# Patient Record
Sex: Male | Born: 1950 | Race: White | Hispanic: No | Marital: Married
Health system: Northeastern US, Academic
[De-identification: ages and names within clinical notes are randomized; demographics above are authoritative.]

---

## 2022-05-01 IMAGING — CT CTA CORONARY
3 series · 14 of 20 positions shown, 16 images · IV contrast (isovue)
Comparison: None.

________________________________________________________________________________________________ 
CTA CORONARY, 05/01/2022 [DATE]: 
CLINICAL INDICATION: Coronary atherosclerosis. 
A search for DICOM formatted images was conducted for prior CT imaging studies 
completed at a non-affiliated media free facility.
TECHNIQUE: The region of interest was scanned with contrast on a high resolution 
CT scanner using dose reduction techniques. 3-D renderings were reconstructed on 
an independent rotation. 100 mL of Isovue 370 MDV were administered per 
protocol. 0 mL of Isovue 370 MDV were discarded. Count of known CT and Cardiac 
Nuclear Medicine studies performed in the previous 12 months = 0. 
Images were performed and reconstructed using ECG gating. To reduce the risk of 
nephrotoxicity and to adequately monitor the patient, the patient was monitored 
for over 30 minutes post procedure with 250 mL of normal saline administered 
intravenously. 
CORONARY ARTERY EVALUATION: 
Dominance: The patient is right dominant. 
Left Main: No plaque or stenosis. 
Left Anterior Descending: Moderate/moderate severe atherosclerotic plaque 
causing approximately 60% stenosis.. 
Left Circumflex: No plaque or stenosis. 
Right Coronary: No plaque or stenosis. 
CARDIAC ANATOMY: No chamber enlargement or hypertrophy.  No evidence of 
hypoperfusion or infarct. 
CARDIAC INDICES:  
Ejection fraction: 68% 
Ventricular end diastolic volume: 121 mL 
Ventricular end systolic volume: 39 mL 
Stroke volume: 82 mL 
Cardiac output 5.9 L/min 
AORTA/GREAT VESSELS: No aneurysm or dissection. 
LUNGS/ PLEURA: Peribronchial thickening and dependent atelectasis..  No 
effusion. 
MEDIASTINUM: No mass. 
OSSEOUS STRUCTURES: No acute fracture or destructive lesion.

[Series 5: corcta 0.75 i40s 2 bestdiast 77% · axial · 0.43mm/px · z∈[-195,-132]mm · 3 of 319 slices shown]
[im 80/319  vessel]
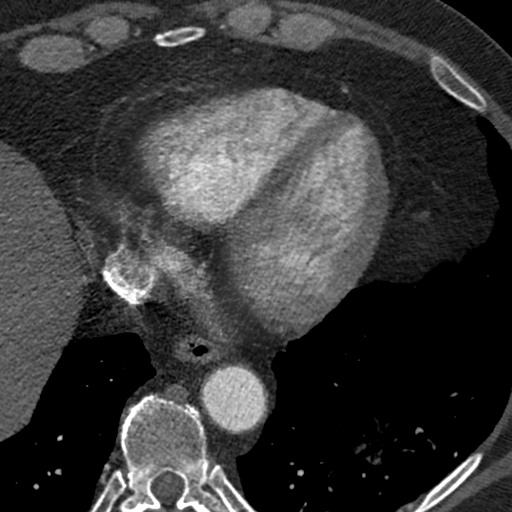
[im 160/319  vessel]
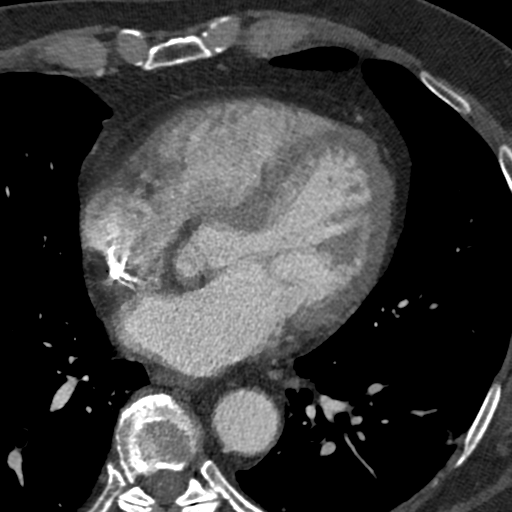
[im 239/319  vessel]
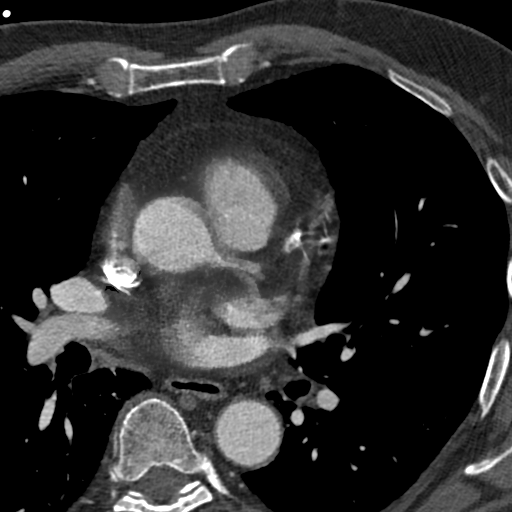

[Series 6: corcta 0.75 i40s 2 bestsyst 29% · axial · 0.43mm/px · z∈[-195,-132]mm · 3 of 319 slices shown]
[im 80/319  vessel]
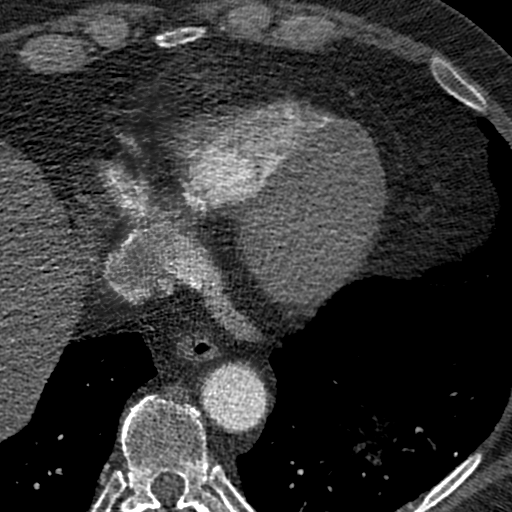
[im 160/319  vessel]
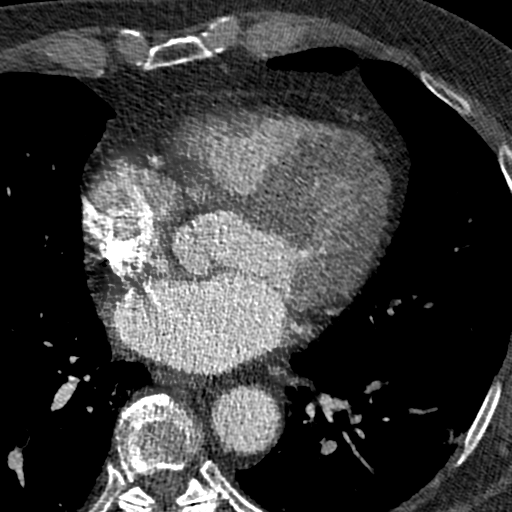
[im 239/319  vessel]
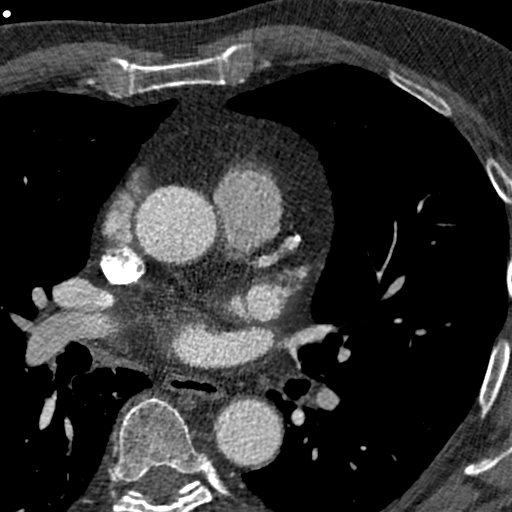

[Series 7: function 0-90% · axial · 0.43mm/px · z∈[-212,-114]mm · 8 of 640 slices shown, 10 images]
[im 72/640  vessel]
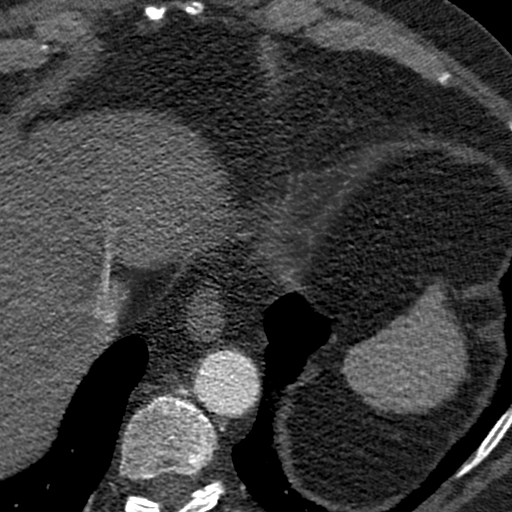
[im 72/640  lung]
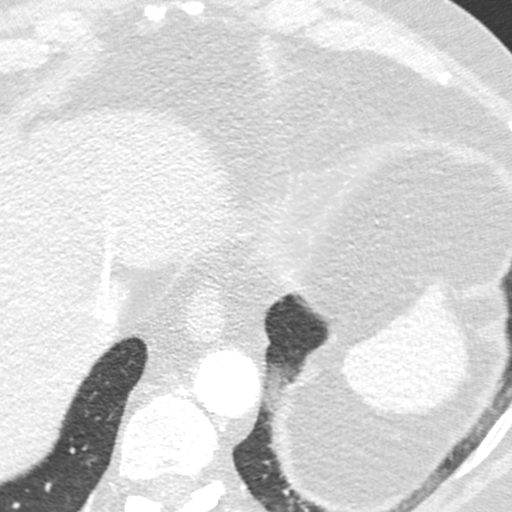
[im 143/640  vessel]
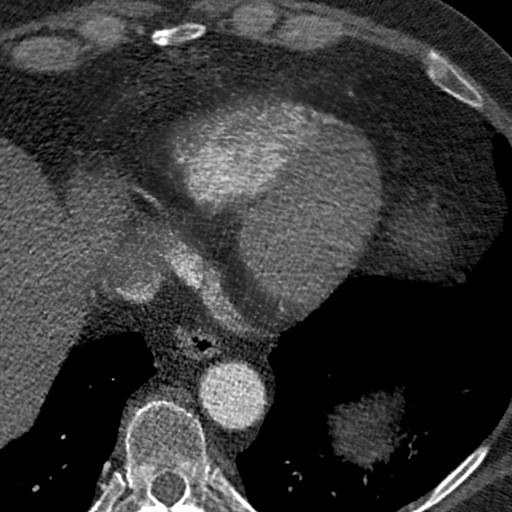
[im 214/640  vessel]
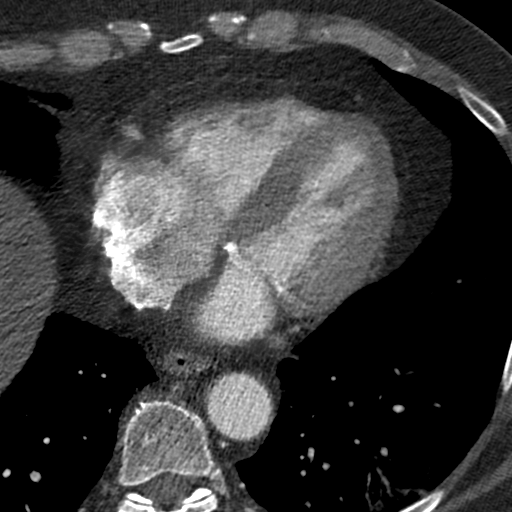
[im 285/640  vessel]
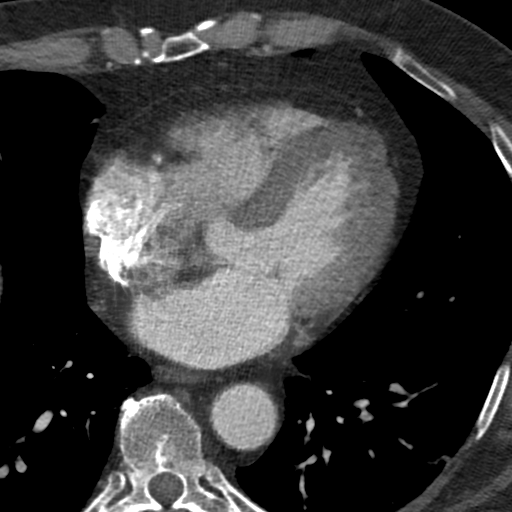
[im 356/640  vessel]
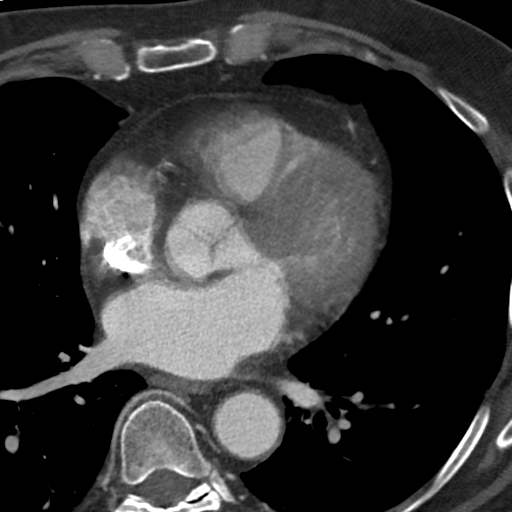
[im 356/640  lung]
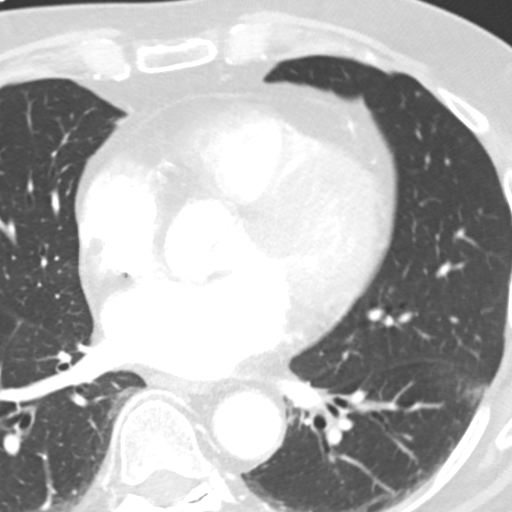
[im 427/640  vessel]
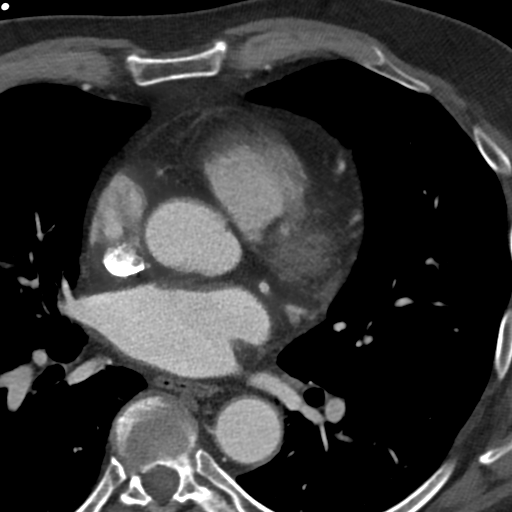
[im 498/640  vessel]
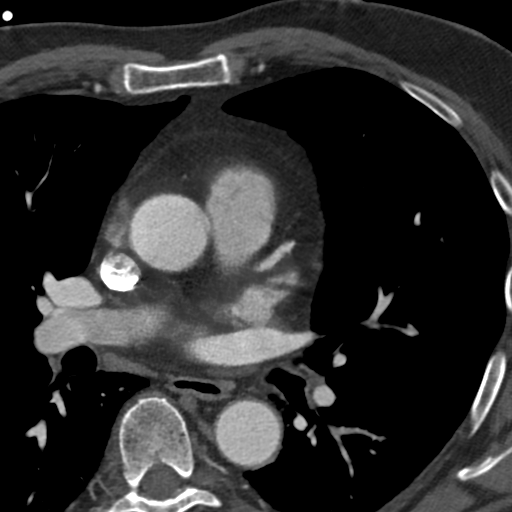
[im 569/640  vessel]
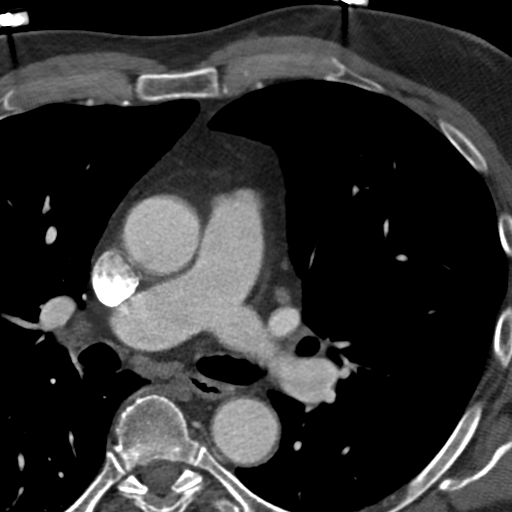

[14 of 20 positions shown; findings below may reference images not displayed]

IMPRESSION: 1. 60% stenosis left anterior descending artery.. 
2. No significant focal stenosis left main, left circumflex or right coronary 
artery. 
3. No significant extra-cardiac findings. 
CAD RADS: 
CAD-RADS 3      50-69%     Moderate stenosis 
RADIATION DOSE REDUCTION: All CT scans are performed using radiation dose 
reduction techniques, when applicable.  Technical factors are evaluated and 
adjusted to ensure appropriate moderation of exposure.  Automated dose 
management technology is applied to adjust the radiation doses to minimize 
exposure while achieving diagnostic quality images.

## 2022-06-21 ENCOUNTER — Inpatient Hospital Stay: Admit: 2022-06-21 | Discharge: 2022-06-21 | Payer: PRIVATE HEALTH INSURANCE

## 2022-06-21 IMAGING — MR MRI LUMBAR SPINE WITHOUT CONTRAST
7 of 8 series · 15 of 48 positions shown · IV contrast (gadolinium)
Comparison: None

________________________________________________________________________________________________ 
MRI LUMBAR SPINE WITHOUT CONTRAST, 06/21/2022 [DATE]: 
CLINICAL INDICATION: Lumbar spinal stenosis. Chronic low back pain radiates down 
the right leg. History of bladder cancer.
TECHNIQUE: Multiplanar, multiecho position MR images of the lumbar spine were 
performed without intravenous gadolinium enhancement. Patient was scanned on a 
1.5T magnet.

[Series 101: survey · axial · 10.0mm · 1.25mm/px · z∈[-33,+201]mm · 2 of 10 slices shown]
[im 1/10]
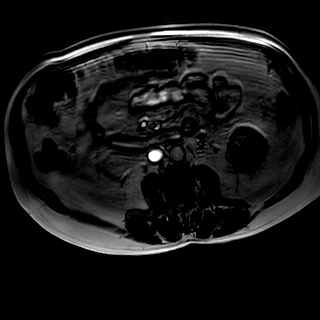
[im 10/10]
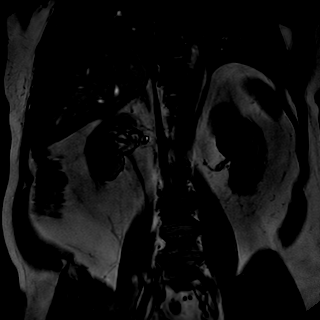

[Series 201: t2w_cor-surv · coronal · 6.0mm · 0.62mm/px · 1 of 10 slices shown]
[im 1/10]
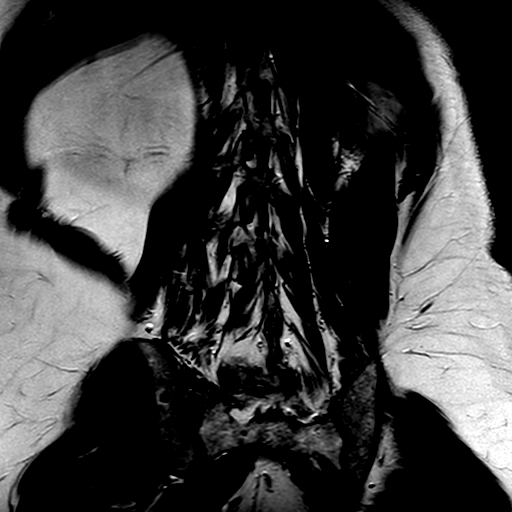

[Series 301: T1 · sagittal · 4.0mm · 0.46mm/px · 2 of 18 slices shown]
[im 1/18]
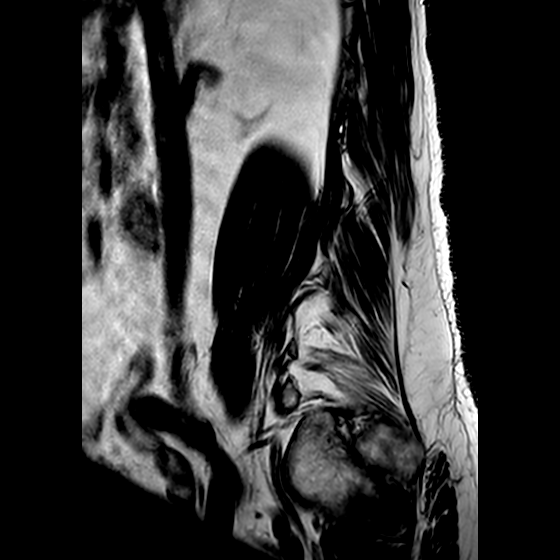
[im 18/18]
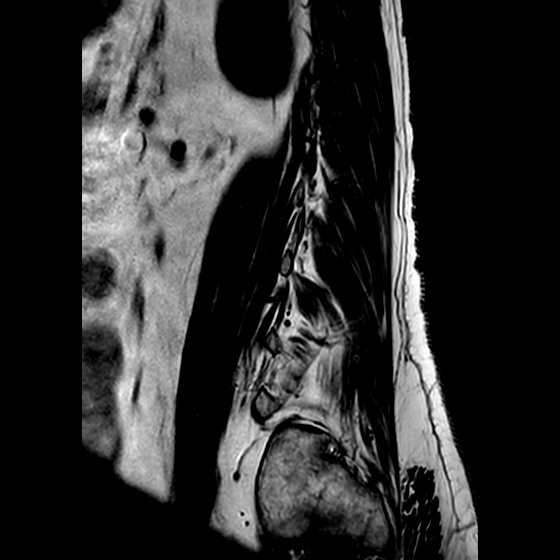

[Series 402: (id)_mdixon_tse · sagittal · 4.0mm · 0.50mm/px · 2 of 18 slices shown]
[im 1/18]
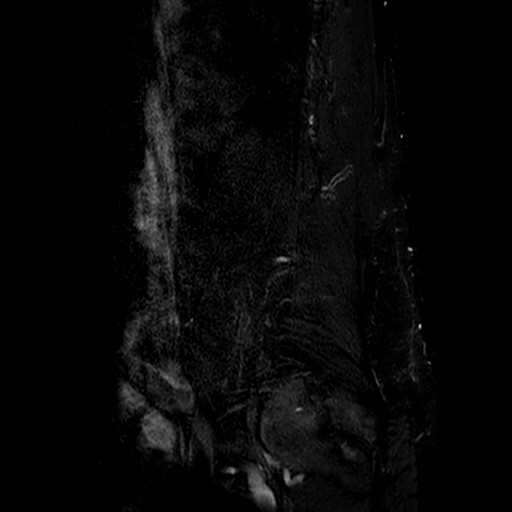
[im 18/18]
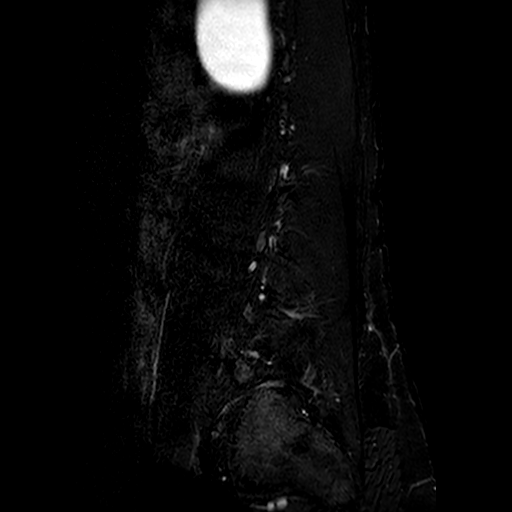

[Series 403: st2w_mdixon_tse · sagittal · 4.0mm · 0.50mm/px · 2 of 18 slices shown]
[im 1/18]
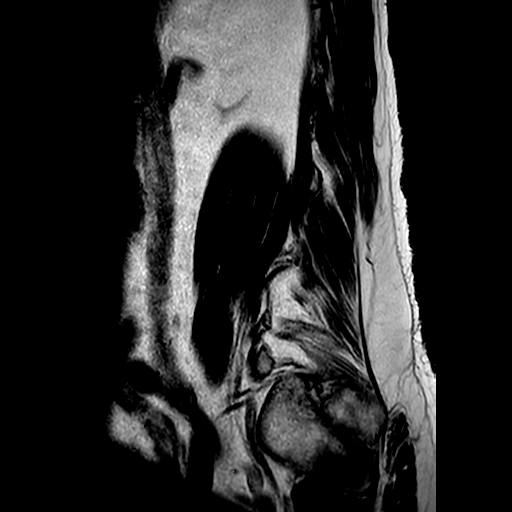
[im 18/18]
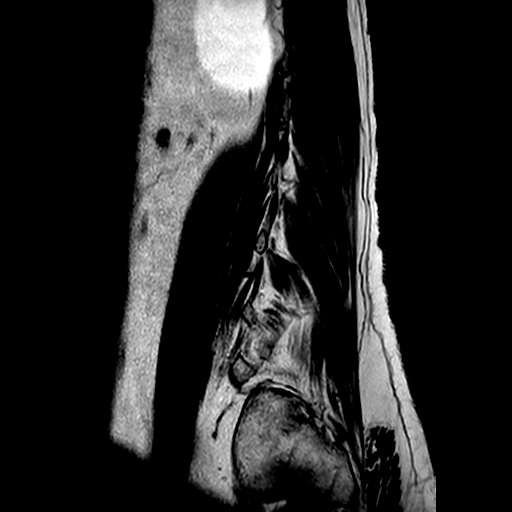

[Series 501: sag spineview ax · sagittal · 1.4mm · 0.33mm/px · 2 of 114 slices shown]
[im 1/114]
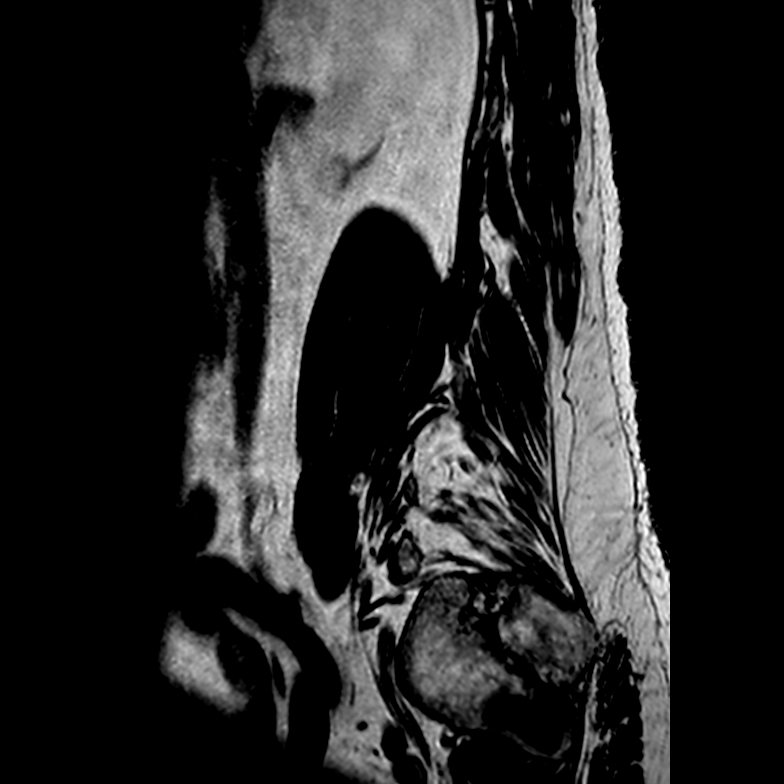
[im 17/114]
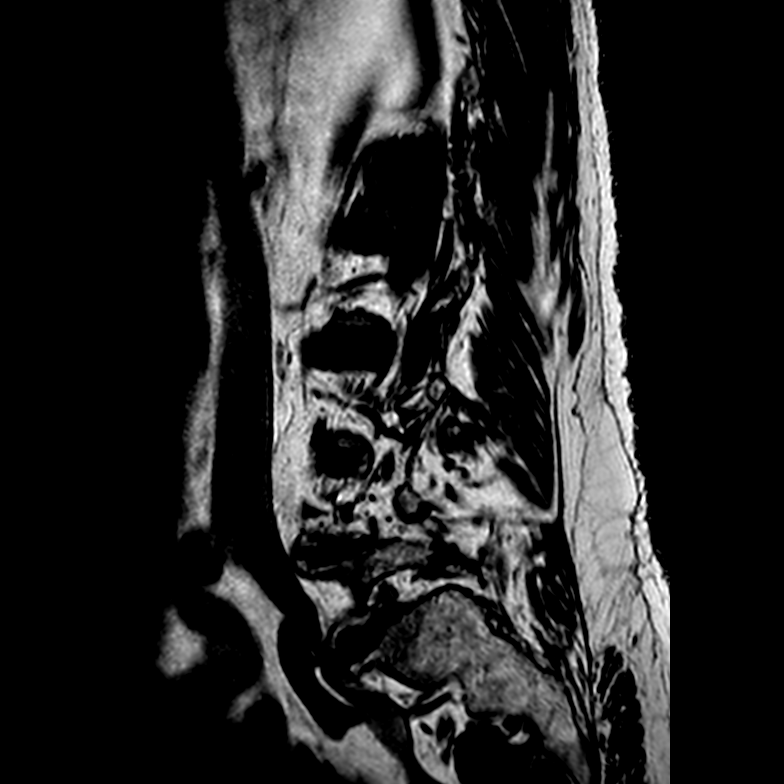

[Series 601: T2 · axial · 4.0mm · 0.30mm/px · z∈[-194,+41]mm · 4 of 30 slices shown]
[im 1/30]
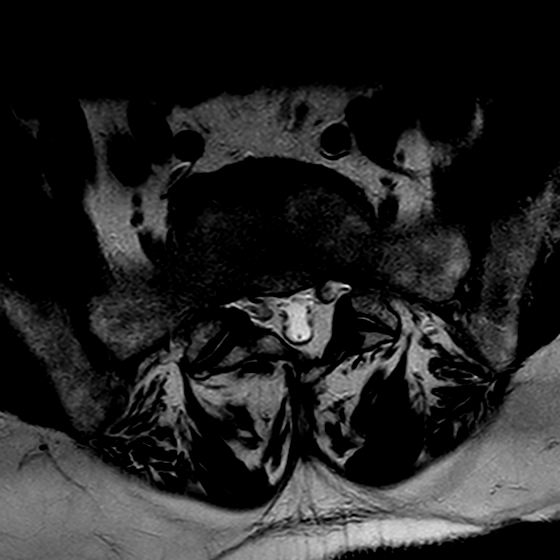
[im 10/30]
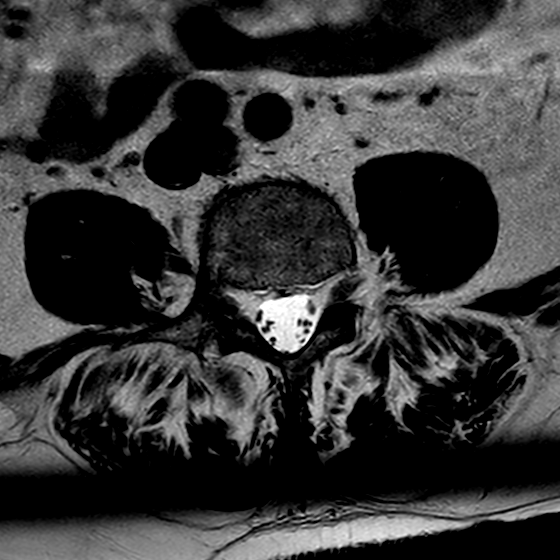
[im 20/30]
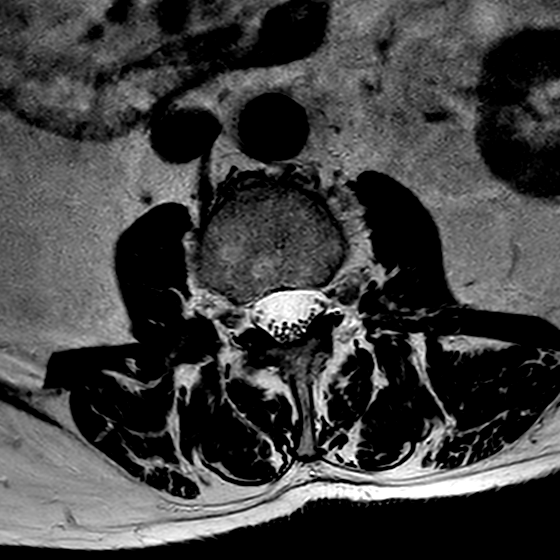
[im 30/30]
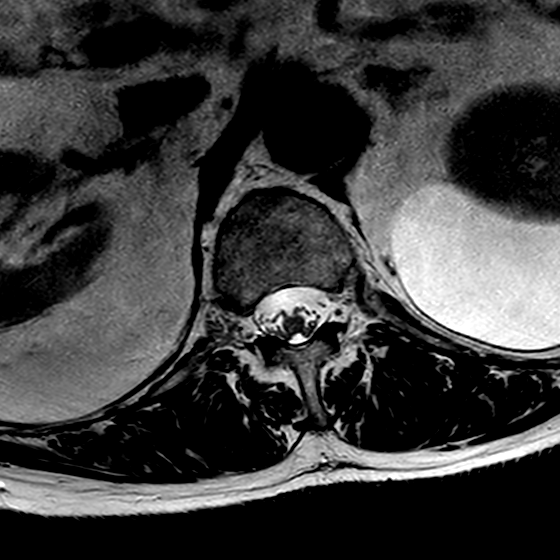

[15 of 48 positions shown; findings below may reference images not displayed]

FINDINGS: -------------------------------------------------------------------------------- 
------ 
GENERAL: 
Nomenclature is based on 5 lumbar type vertebral bodies.     
ALIGNMENT: Minimal levoconvex lumbar scoliosis. Loss of the normal lumbar 
lordosis with trace grade 1 retrolisthesis L2 on L3 and L3 on L4. Trace grade 1 
retrolisthesis L4 on L5. 8 mm anterolisthesis L5 on S1 related to bilateral L5 
spondylolysis. 
VERTEBRAL BODY HEIGHT: Normal.  
MARROW SIGNAL: There is minimal marrow edema within the left L5 pedicle which 
may reflect stress reaction. 
CORD SIGNAL: Normal distal spinal cord and cauda equina. Conus medullaris 
terminates at L1. 
ADDITIONAL FINDINGS: Exophytic renal cysts are incompletely visualized, the 
largest is present off the posterior aspect of the left kidney and measures at 
least 7.8 cm. 
Modic I-II: L4-L5 
Ligamentum Flavum > 2.5 mm: All levels. 
-------------------------------------------------------------------------------- 
------ 
SEGMENTAL: Mild degenerative changes T11-T12. 
T12-L1: Normal disc height and signal. No herniation. Normal facets. No spinal 
canal or neural foraminal stenosis. 
L1-L2: Slight loss of disc signal with small Schmorls node. Minimal annular 
bulge. Canal and foramina are patent. Small bilateral facet joint effusions. 
L2-L3: Loss of disc signal. Minimal annular bulge. Canal and foramina are 
patent. Bilateral facet joint effusions. 
L3-L4: Loss of disc signal with minimal 1-2 mm central disc protrusion. Canal 
and foramina are patent. Small bilateral facet joint effusions. 
L4-L5: Moderate to severe loss of disc height specifically to the right. Loss of 
disc signal. Anterior annular fissure. Canal patent with slight narrowing of the 
lateral recesses bilaterally. Facet arthropathy. Moderate right foraminal 
narrowing. Left foramen patent. 
L5-S1: Loss of disc signal. Anterolisthesis with disc uncovering and evidence of 
left paracentral annular fissure. Canal patent although there is specific 
narrowing of the right lateral recess by facet arthropathy, ligamentum flavum 
hypertrophy, posterior osteophyte ridging, and disc bulge. This could affect the 
traversing right S1 nerve root. Extensive facet arthropathy. Moderate to severe 
right foraminal narrowing could also affect the exiting right L5 nerve root. 
There is moderate left foraminal narrowing. 
-------------------------------------------------------------------------------- 
------
IMPRESSION: Minimal marrow edema left L5 pedicle may reflect stress reaction. 
Multilevel degenerative and scoliotic changes. 
L5-S1, findings that could affect the exiting right L5 and/or traversing right 
S1 nerve roots. Details above. 
Other less significant lumbar degenerative changes.

## 2022-07-15 IMAGING — MR MULTI PARAMETRIC MRI PROSTATE W/O AND W
13 series · 48 of 48 positions shown · IV contrast (gadavist)
Comparison: None

________________________________________________________________________________________________ 
MULTI PARAMETRIC MRI PROSTATE W/O AND W, 07/15/2022 [DATE]: 
CLINICAL INDICATION: Elevated PSA
TECHNIQUE: Multiple parametric sequences were performed Pre-contrast: T1 axial 
of the entire pelvis. T2 sagittal, axial  and coronal,T1 axial, acquired of the 
prostate. Diffusion with multiple B values of 6000, 0533 calculated ADC value 
for mapping. Post contrast: Rapid sequence dynamic and axial planes through the 
prostate and seminal vesicles,T1 axial with fat sat of the entire pelvis. 3-D 
renderings were reconstructed on an independent workstation. The images were 
also evaluated with Dyna CAD computer aided detection. 9.5 mL of Gadavist were 
injected intravenously. 0.5 mL was discarded. As per [HOSPITAL] guidelines 3D reconstructions are performed with concurrent physician 
supervision. Patient was scanned on a 3T magnet.

[Series 101: survey-mst · axial · 10.0mm · 1.34mm/px · 1 of 14 slices shown]
[im 1/14]
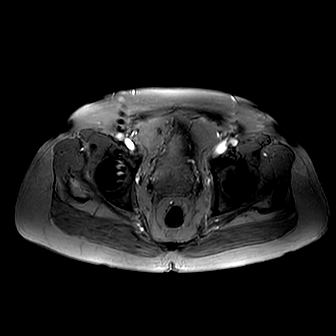

[Series 201: t1w_tse_ax · axial · 6.0mm · 0.40mm/px · 1 of 36 slices shown]
[im 1/36]
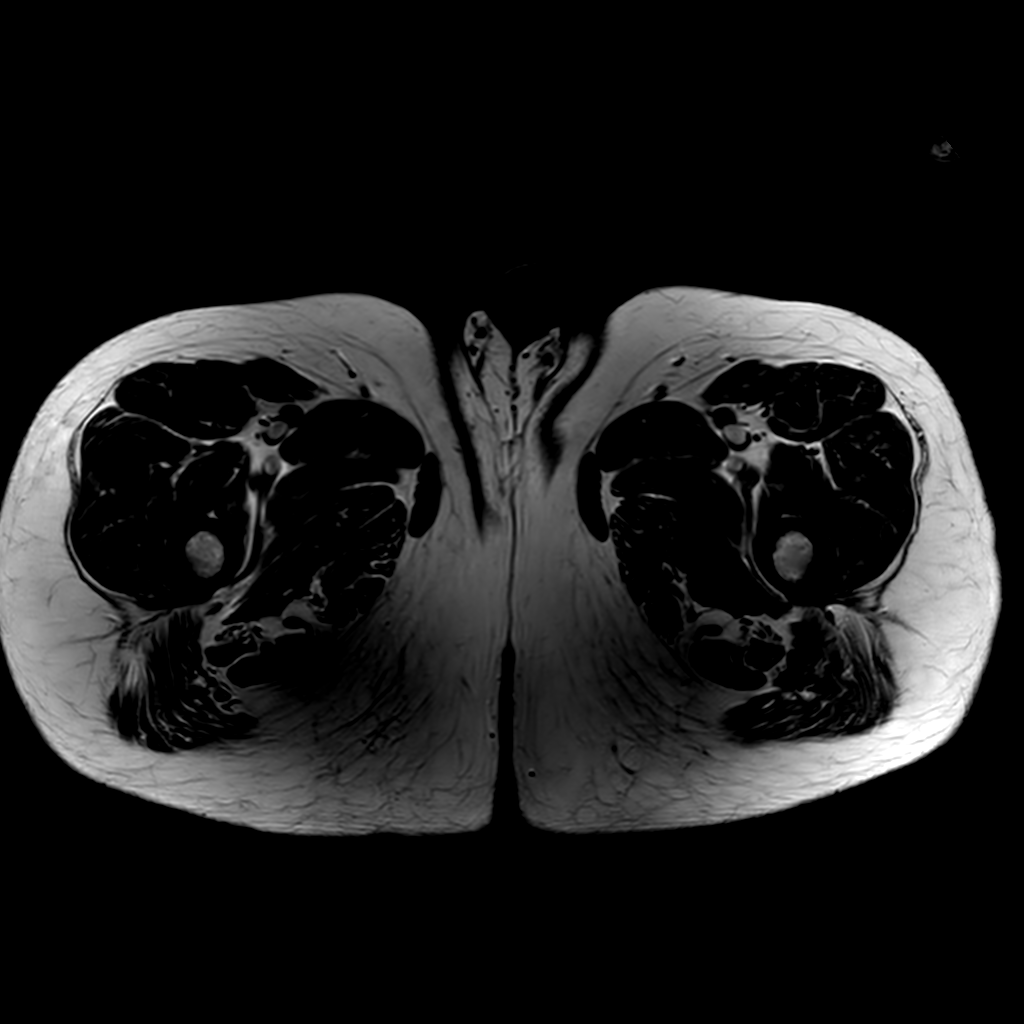

[Series 301: t2w sag · sagittal · 3.0mm · 0.42mm/px · 1 of 30 slices shown]
[im 1/30]
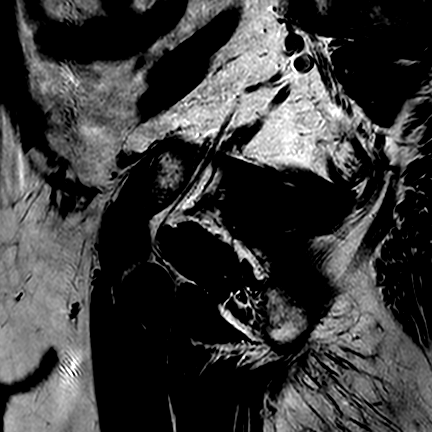

[Series 401: t2w cor · coronal · 3.0mm · 0.42mm/px · 1 of 30 slices shown]
[im 1/30]
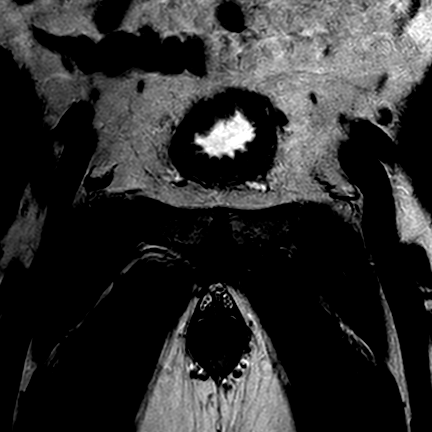

[Series 501: t2w ax · axial · 3.0mm · 0.38mm/px · 1 of 32 slices shown]
[im 1/32]
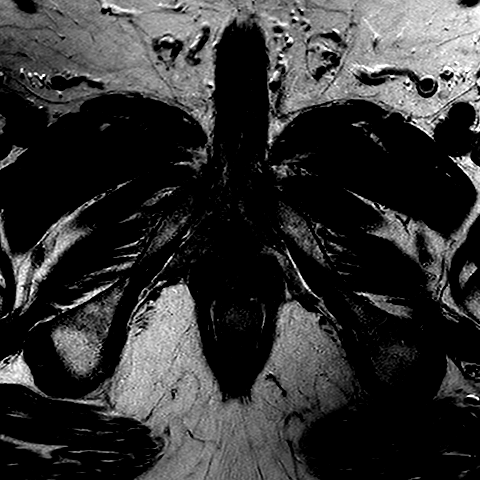

[Series 601: new-dwi_3b* 3mm* · axial · 3.0mm · 1.28mm/px · 1 of 64 slices shown]
[im 1/64]
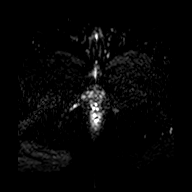

[Series 602: ADC · axial · 3.0mm · 1.28mm/px · 1 of 32 slices shown (1 of 2)]
[im 1/32]
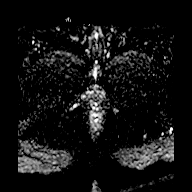

[Series 603: ADC · axial · 3.0mm · 1.28mm/px · 1 of 32 slices shown (2 of 2)]
[im 1/32]
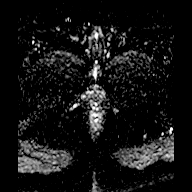

[Series 604: (id) · axial · 3.0mm · 1.28mm/px · 1 of 32 slices shown (1 of 3)]
[im 1/32]
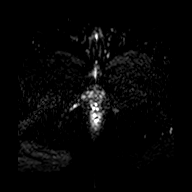

[Series 605: (id) · axial · 3.0mm · 1.28mm/px · 1 of 32 slices shown (2 of 3)]
[im 1/32]
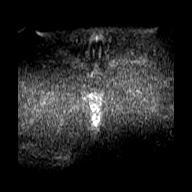

[Series 701: (id) · axial · 3.0mm · 1.28mm/px · 1 of 32 slices shown (3 of 3)]
[im 1/32]
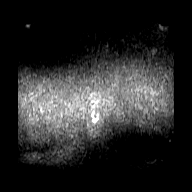

[Series 901: dyn 3mm*(ap) · axial · 3.0mm · 1.38mm/px · z∈[-79,+14]mm · 36 of 1440 slices shown]
[im 1/1440]
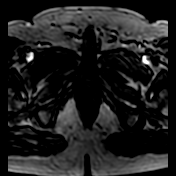
[im 42/1440]
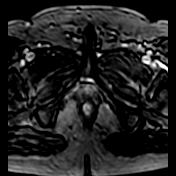
[im 83/1440]
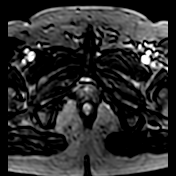
[im 124/1440]
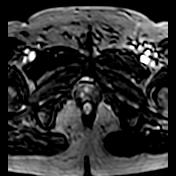
[im 165/1440]
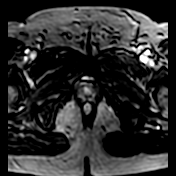
[im 206/1440]
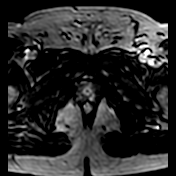
[im 247/1440]
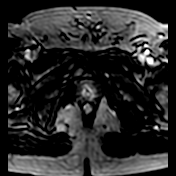
[im 288/1440]
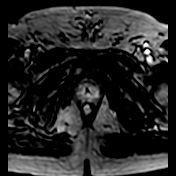
[im 329/1440]
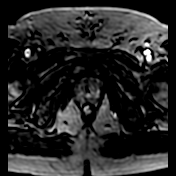
[im 371/1440]
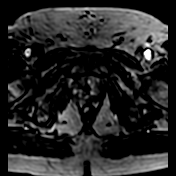
[im 412/1440]
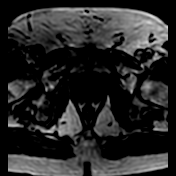
[im 453/1440]
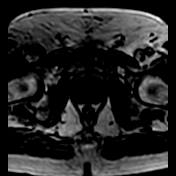
[im 494/1440]
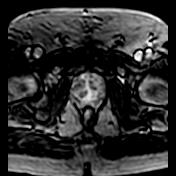
[im 535/1440]
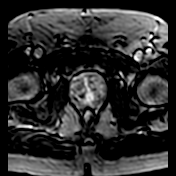
[im 576/1440]
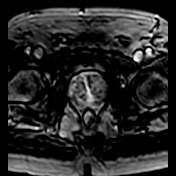
[im 617/1440]
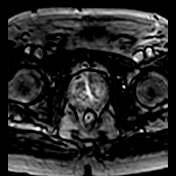
[im 658/1440]
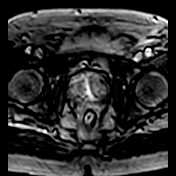
[im 699/1440]
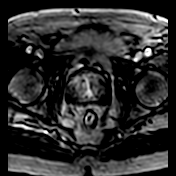
[im 741/1440]
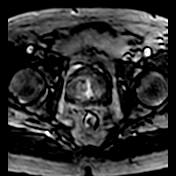
[im 782/1440]
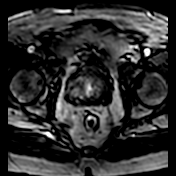
[im 823/1440]
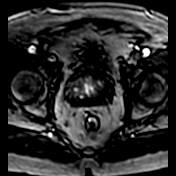
[im 864/1440]
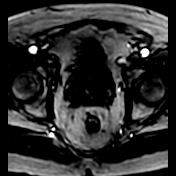
[im 905/1440]
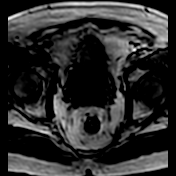
[im 946/1440]
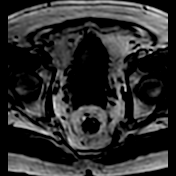
[im 987/1440]
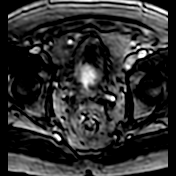
[im 1028/1440]
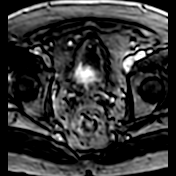
[im 1069/1440]
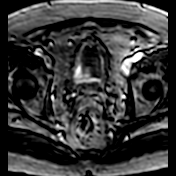
[im 1111/1440]
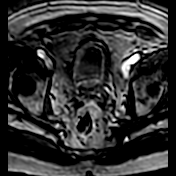
[im 1152/1440]
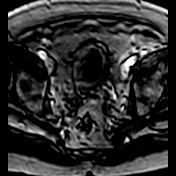
[im 1193/1440]
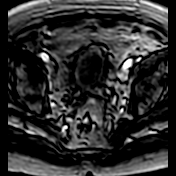
[im 1234/1440]
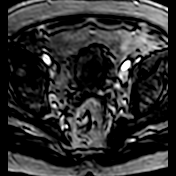
[im 1275/1440]
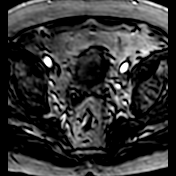
[im 1316/1440]
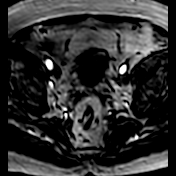
[im 1357/1440]
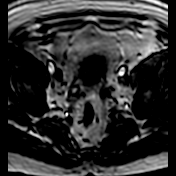
[im 1398/1440]
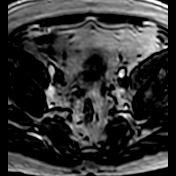
[im 1440/1440]
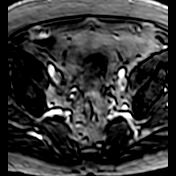

[Series 1001: DIXON · axial · 6.0mm · 0.47mm/px · 1 of 72 slices shown]
[im 1/72]
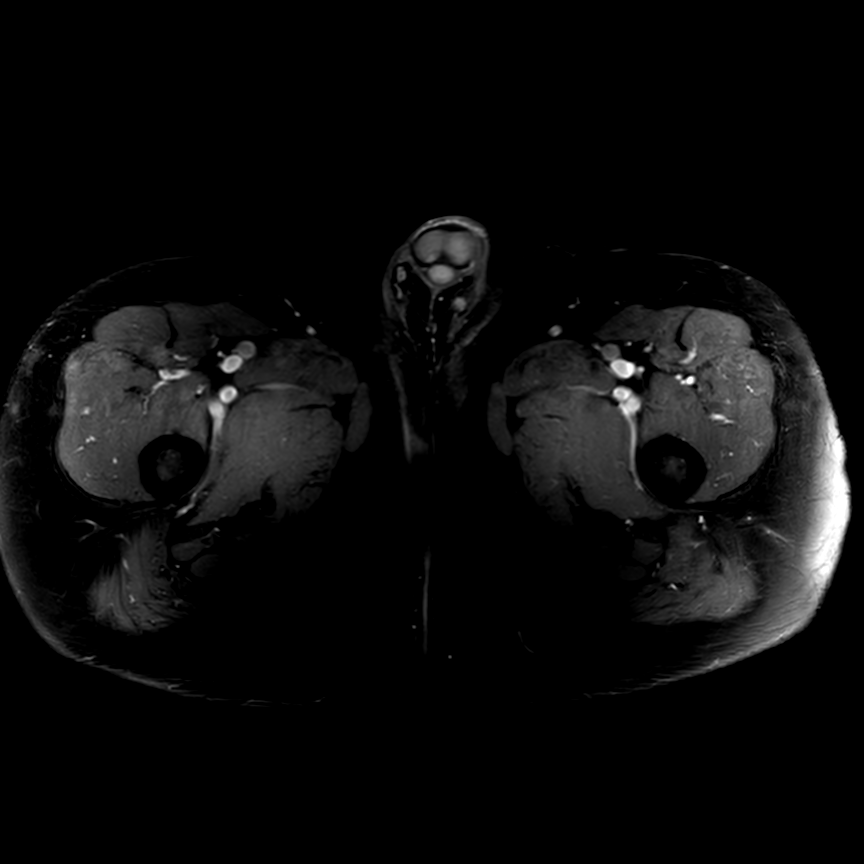

[48 of 48 positions shown; findings below may reference images not displayed]

FINDINGS: VOLUME: 79 cc 
CENTRAL GLAND: Nodular in appearance. Elevates the bladder base. No discrete 
suspicious lesion seen. 
PERIPHERAL ZONE: Within the anterior lateral aspect of the right peripheral zone 
just below the base is a nodular area of decreased T2 signal measuring upwards 
of 1.6 to 1.7 cm on axial T2 image 18 and coronal image 15. This shows 
restricted diffusion concerning for malignancy. 
PROSTATE CAPSULE: The area of concern is contiguous with the capsule in this 
region with slight capsular bulge though is no abnormal soft tissue seen outside 
the capsule on this exam. 
MUSCLE SIDE WALLS: Normal in appearance. 
SEMINAL VESICLES: Normal in appearance. 
BLADDER: Thickened and trabeculated. 
LYMPHADENOPATHY: Nonenlarged lymph nodes are seen bilaterally. 
BONES: No osseous lesion identified. 
ADDITIONAL FINDINGS: Degenerative changes lower lumbar spine.
IMPRESSION: Concerning lesion anterior lateral right peripheral zone just below the base. 
(PI-RADS 5): Very high (clinically significant cancer is highly likely to be 
present).

## 2022-07-23 ENCOUNTER — Telehealth: Admit: 2022-07-23 | Payer: PRIVATE HEALTH INSURANCE | Attending: Urology

## 2022-07-23 NOTE — Telephone Encounter
CALLED [facility] [phone] TO REQUEST MEDICAL RECORDS REGARDING HIS BLADDER CANCER.  REQUESTED NOTES AND TREATMENTS HE RECEIVED.  hE WILL BE COMING HERE IN JULY TO HAVE A CYSTOSCOPY WITH DR RENZULLI.  ALSO REQUESTED MRI OF HIS PROSTATE.  INSTRUCTED TO FX BUT ALSO ASKED TO CALL HERE IF THEY NEED ANY OTHER INFO

## 2022-08-14 ENCOUNTER — Encounter: Admit: 2022-08-14 | Payer: PRIVATE HEALTH INSURANCE

## 2022-08-24 ENCOUNTER — Telehealth: Admit: 2022-08-24 | Payer: PRIVATE HEALTH INSURANCE | Attending: Urology

## 2022-08-24 NOTE — Unmapped
Called dr klutke's office to inquire about prostate biopsy results, instructed to fax request, completed fax with demo sheet.  Reception did say results are taking a little longer then usual, will follow up during the week

## 2022-08-25 ENCOUNTER — Telehealth: Admit: 2022-08-25 | Payer: PRIVATE HEALTH INSURANCE | Attending: Urology

## 2022-08-25 NOTE — Unmapped
IMAGING OF PROSTATE MRI ADDED TO POWERSHARE, AWAITING RESULTS OF BIOPSY TO REQUEST SLIDE REVIEW FROM PATHOLOGY

## 2022-08-27 ENCOUNTER — Telehealth: Admit: 2022-08-27 | Payer: PRIVATE HEALTH INSURANCE | Attending: Urology

## 2022-08-27 NOTE — Unmapped
CALLED AGAIN AND REQUESTED RESULTS OF PROSTATE BIOPSY BE FAXED, ASKED FOR A CALL BACK ON BACK LINE

## 2022-08-31 ENCOUNTER — Telehealth: Admit: 2022-08-31 | Payer: PRIVATE HEALTH INSURANCE | Attending: Urology

## 2022-08-31 NOTE — Unmapped
Received prostate pathology, [name] reviewed, all specimens are benign, patient has chronic prostatitis.  Called and s/w Wife Bobby Murray and gave her the results, call out to patient too.

## 2022-08-31 NOTE — Unmapped
Called Dr Buelah Manis office and they have not received the prostte biopsy results.  They stated patient had biopsy at [facility] hospital  called there [phone] and spoke with pathology.  Women stated they just finalized the results.  She will fax them over.

## 2022-09-11 ENCOUNTER — Encounter: Admit: 2022-09-11 | Payer: PRIVATE HEALTH INSURANCE

## 2022-09-11 DIAGNOSIS — C679 Malignant neoplasm of bladder, unspecified: Secondary | ICD-10-CM

## 2022-09-17 ENCOUNTER — Telehealth: Admit: 2022-09-17 | Payer: PRIVATE HEALTH INSURANCE | Attending: Urology

## 2022-09-17 ENCOUNTER — Inpatient Hospital Stay: Admit: 2022-09-17 | Discharge: 2022-09-17 | Payer: PRIVATE HEALTH INSURANCE

## 2022-09-17 DIAGNOSIS — C679 Malignant neoplasm of bladder, unspecified: Secondary | ICD-10-CM

## 2022-09-17 LAB — TESTOSTERONE, TOTAL     (BH GH L LMW YH): BKR TESTOSTERONE, TOTAL (LM) 2: 563.6 ng/dL (ref 86.98–780.10)

## 2022-09-17 LAB — PSA, TOTAL (POST DIAGNOSIS) (BH GH LMW YH): BKR PROSTATE SPECIFIC ANTIGEN, TOTAL (LM): 3.96 ng/mL (ref <4.00)

## 2022-09-17 NOTE — Telephone Encounter
This RN received an incoming phone call from [facility] urology office in [facility]. The RN from the [place] office states that the patient has a cytology with each cystoscopy and a bladder washing. The office also collects a PSA at that time. The RN from the [place] office asked if we could fax over the providers note, along with the cytology results to their office. Fax: [phone]. Tel: [phone]

## 2022-09-18 ENCOUNTER — Telehealth: Admit: 2022-09-18 | Payer: PRIVATE HEALTH INSURANCE | Attending: Urology

## 2022-09-18 ENCOUNTER — Inpatient Hospital Stay: Admit: 2022-09-18 | Discharge: 2022-09-18 | Payer: PRIVATE HEALTH INSURANCE

## 2022-09-18 ENCOUNTER — Encounter: Admit: 2022-09-18 | Payer: PRIVATE HEALTH INSURANCE

## 2022-09-18 DIAGNOSIS — C679 Malignant neoplasm of bladder, unspecified: Secondary | ICD-10-CM

## 2022-09-18 NOTE — Telephone Encounter
Instructed patient to get the cytology done one week prior to his cysto, waiting to hear back from him

## 2022-09-24 ENCOUNTER — Inpatient Hospital Stay: Admit: 2022-09-24 | Discharge: 2022-09-24 | Payer: PRIVATE HEALTH INSURANCE

## 2022-09-24 ENCOUNTER — Ambulatory Visit: Admit: 2022-09-24 | Payer: PRIVATE HEALTH INSURANCE | Attending: Urology

## 2022-09-25 DIAGNOSIS — C679 Malignant neoplasm of bladder, unspecified: Secondary | ICD-10-CM

## 2022-09-30 ENCOUNTER — Ambulatory Visit: Admit: 2022-09-30 | Payer: PRIVATE HEALTH INSURANCE | Attending: Vascular and Interventional Radiology

## 2022-10-06 ENCOUNTER — Telehealth: Admit: 2022-10-06 | Payer: PRIVATE HEALTH INSURANCE | Attending: Urology

## 2022-10-06 NOTE — Telephone Encounter
Patient aware cytology is negative.  Faxed results to Dr Rennie Natter office

## 2022-10-13 ENCOUNTER — Ambulatory Visit: Admit: 2022-10-13 | Payer: PRIVATE HEALTH INSURANCE | Attending: Urology

## 2022-10-13 ENCOUNTER — Encounter: Admit: 2022-10-13 | Payer: PRIVATE HEALTH INSURANCE | Attending: Urology

## 2022-10-13 DIAGNOSIS — R935 Abnormal findings on diagnostic imaging of other abdominal regions, including retroperitoneum: Secondary | ICD-10-CM

## 2022-10-13 DIAGNOSIS — Z8551 Personal history of malignant neoplasm of bladder: Secondary | ICD-10-CM

## 2022-10-13 DIAGNOSIS — N402 Nodular prostate without lower urinary tract symptoms: Secondary | ICD-10-CM

## 2022-10-13 DIAGNOSIS — Z8042 Family history of malignant neoplasm of prostate: Secondary | ICD-10-CM

## 2022-10-13 MED ORDER — LIDOCAINE 2 % MUCOSAL JELLY IN APPLICATOR
2 | Status: CP
Start: 2022-10-13 — End: ?
  Administered 2022-10-13: 13:00:00 2 %

## 2022-10-13 NOTE — Progress Notes
CC:  Nonmuscle invasive bladder cancer and prostate MRI abnormalityHPIEdmund Ramani is a 72 y.o. 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  He is followed closely by [facility].  He was diagnosed last year and completed induction BCG 2023 and 1 maintenance therapy in the spring of 2024.  He has tolerated treatment well.  Has had no frequency, urgency or hematuria since his last cystoscopy in the spring.  Presents for cystoscopic evaluation while in [place].  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  PSA was 4.Past Medical HistoryHypertensionBladder cancerProstate nodulePast Surgical HistoryTURBTProstate biopsyAllergiesAllergies Allergen Reactions  Bee Venom Protein (Honey Bee) Swelling MedicationsOutpatient Encounter Medications as of 10/13/2022 Medication Sig Dispense Refill  amLODIPine (NORVASC) 10 mg tablet     EPIPEN 2-PAK 0.3 mg/0.3 mL auto-injector INJECT 0.3 MILLILITER (0.3 MG) BY INTRAMUSCULAR ROUTE ONCE AS NEEDED FOR ANAPHYLAXIS    lisinopriL (PRINIVIL,ZESTRIL) 10 mg tablet    Facility-Administered Encounter Medications as of 10/13/2022 Medication Dose Route Frequency Provider Last Rate Last Admin  [COMPLETED] lidocaine uro-jet (XYLOCAINE) 2 % jelly        Given at 10/13/22 0853  Family History Family history of prostate cancerPhysical ExamBP (!) 155/85  - Pulse 84  - Ht 5' 9 (1.753 m)  - Wt 90.7 kg  - SpO2 98%  - BMI 29.53 kg/m? Constitutional:  Well developed, no acute distress.  Assessment:Math Lamorte is a 72 y.o. male with a history of nonmuscle invasive bladder cancer diagnosed in [place].  He received induction BCG and continues on maintenance BCG.  Also has a family history of prostate cancer.  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  He will continue his maintenance BCG in [place].Orders Placed This Encounter Procedures  MRI RESULT SCAN  POCT urinalysis dipstick- [place] (LMW) No follow-ups on file.Patient will let us know if any new problems or symptoms arise in the interim.Metro Kung II, MDAssociate professor of UrologyYale School of Medicine

## 2022-10-13 NOTE — Progress Notes
Time Out DocumentationProcedure Name: cystoscopyProcedure Time: 8:45 AM Team Members Present: Linward Foster, RN, Metro Kung, MD B PRice UATime Out initiated after patient positioned & prior to beginning of procedure: YesName of Patient  & MRUN # or DOB stated and matches ID band or previously confirmed med record number: YesProceduralist states or confirms procedure to be performed: YesProcedural consent is used to verify procedure performed  & matches pt identifiers:YesSite of procedure(s) (with laterality or level) is topically marked per policy & visible after draping:N/A Final check completed on all specimens: not applicableSerial number on device: [id number]

## 2022-10-13 NOTE — Procedures
Office Cystoscopy Note Diagnosis: Encounter Diagnoses Name Primary?  History of bladder cancer Yes  Prostate nodule   Family history of prostate cancer   Abnormal MRI, pelvis  Indications:  History of bladder cancerAppropriate consent and time-out was performed prior to cystoscopy.Cystoscopy:  The patient was in the supine position. Genitalia were prepped with Betadine and draped.  A flexible cystoscope was placed into the urethra.  Urethra was normal throughout.  Systematic inspection of the bladder was carried out.  Prior biopsy site left lateral wall stellate scar no residual or recurrent tumor noted.  No diverticula.  No stones.  No  erythema or papillary lesions.  Mild trabeculation. Right and left ureteral orifices were orthotopic along the trigone.  Retroflexion was carried out and the bladder neck appeared normal.Plan:  Patient will return to [place] for next maintenance BCG cycle in the fall.  Recommend annual cystoscopy when in [place].  PSA screening to resume in [place].

## 2022-10-13 NOTE — Patient Instructions
Patient Education Cystoscopy Discharge Instructions About this topic Your kidneys make urine. It is stored in your bladder. The urethra is a tube at the bottom of the bladder. Urine flows out of this tube. Sometimes, there is a blockage and urine is not able to leave the body.A cystoscopy is a procedure that lets the doctor see the inside of your bladder and urethra. The doctor does it to:Look for stones or tumors blocking the bladder and urethraLook for changes or injury inside the bladderTake a tissue sample from the inside of your bladderLook for reasons for blood in the urine, pain with urination, or why you are passing urine oftenLook for prostate problems What care is needed at home? Ask your doctor what you need to do when you go home. Make sure you ask questions if you do not understand what the doctor says. This way you will know what you need to do.Take a warm bath or use a warm wet washcloth over the opening to the urethra. This will help to ease any pain. Do this as needed.Drink 6 to 8 glasses of water a day and 3 to 4 glasses in the first few hours after the procedure to flush out your bladder and reduce irritation.You may see some blood in your urine for a few days. This is normal.Empty your bladder as soon as you feel the need to. Don't delay going to the bathroom. It stretches and weakens the bladder.What follow-up care is needed? Your doctor may ask you to make visits to the office to check on your progress. Be sure to keep these visits.If you had a biopsy, talk with your doctor about the results.What drugs may be needed? The doctor may order drugs to:Help with painFight an infectionHelp with bladder spasmsWill physical activity be limited? Talk to your doctor about when you may go back to your normal activities like work, driving, or sex.What problems could happen? BleedingInfectionInjury to the bladder and urethraDiscomfort in the urethra areaBurning sensation for a short timeUpset stomachWhen do I need to call the doctor? Signs of infection. These include a fever of 100.4?F (38?C) or higher, chills, pain with passing urine.Pain that does not go away even with drugs or that lasts longer than 2 daysToo much blood in your urinePassing large dime-sized clotsCloudy urineLittle or no urine or not able to pass urineAbdominal pain and nauseaTeach Back: Helping You Understand The Teach Back Method helps you understand the information we are giving you. After you talk with the staff, tell them in your own words what you learned. This helps to make sure the staff has described each thing clearly. It also helps to explain things that may have been confusing. Before going home, make sure you can do these:I can tell you about my procedure.I can tell you what may help ease my pain.I can tell you what I will do if I have a fever, chills, or am not able to pass urine.Where can I learn more? American Cancer Societyhttps://www.cancer.org/treatment/understanding-your-diagnosis/tests/endoscopy/cystoscopy.html Cancer Research UKhttps://www.cancerresearchuk.org/about-cancer/bladder-cancer/getting-diagnosed/tests-diagnose/cystoscopy NHS Choiceshttp://www.nhs.uk/conditions/Cystoscopy/Pages/Introduction.aspx Last Reviewed Date 2021-04-22Consumer Information Use and Disclaimer This generalized information is a limited summary of diagnosis, treatment, and/or medication information. It is not meant to be comprehensive and should be used as a tool to help the user understand and/or assess potential diagnostic and treatment options. It does NOT include all information about conditions, treatments, medications, side effects, or risks that may apply to a specific patient. It is not intended to be medical advice or a substitute for the medical advice, diagnosis, or   treatment of a health care provider based on the health care provider's examination and assessment of a patient?s specific and unique circumstances. Patients must speak with a health care provider for complete information about their health, medical questions, and treatment options, including any risks or benefits regarding use of medications. This information does not endorse any treatments or medications as safe, effective, or approved for treating a specific patient. UpToDate, Inc. and its affiliates disclaim any warranty or liability relating to this information or the use thereof. The use of this information is governed by the Terms of Use, available at https://www.wolterskluwer.com/en/know/clinical-effectiveness-terms Copyright Copyright ? 2023 UpToDate, Inc. and its affiliates and/or licensors. All rights reserved.

## 2022-10-13 NOTE — Progress Notes
Bobby Murray is here today for a cystoscopy.  Patient informed of procedure and questions answered.  Instructions for pre procedure given to patient.  Written consent and time out obtained.  Patient prepped according to protocol.  Tolerated procedure well.  VSS.  Post procedure instructions reviewed with patient.

## 2022-10-14 DIAGNOSIS — I10 Essential (primary) hypertension: Secondary | ICD-10-CM

## 2022-10-14 DIAGNOSIS — C679 Malignant neoplasm of bladder, unspecified: Secondary | ICD-10-CM

## 2022-10-16 ENCOUNTER — Telehealth: Admit: 2022-10-16 | Payer: PRIVATE HEALTH INSURANCE | Attending: Urology

## 2022-10-16 NOTE — Telephone Encounter
Cystoscopy and Dr Grace Isaac last note faxed to Dr Leanna Sato office in [place]

## 2023-08-09 ENCOUNTER — Encounter: Admit: 2023-08-09 | Payer: PRIVATE HEALTH INSURANCE

## 2023-08-24 ENCOUNTER — Ambulatory Visit: Admit: 2023-08-24 | Payer: PRIVATE HEALTH INSURANCE | Attending: Urology

## 2023-10-05 ENCOUNTER — Telehealth: Admit: 2023-10-05 | Payer: PRIVATE HEALTH INSURANCE | Attending: Urology

## 2023-10-05 ENCOUNTER — Encounter: Admit: 2023-10-05 | Payer: PRIVATE HEALTH INSURANCE | Attending: Urology

## 2023-10-05 ENCOUNTER — Ambulatory Visit: Admit: 2023-10-05 | Payer: PRIVATE HEALTH INSURANCE | Attending: Urology

## 2023-10-05 VITALS — BP 169/79 | HR 82 | Ht 69.0 in | Wt 214.0 lb

## 2023-10-05 DIAGNOSIS — N400 Enlarged prostate without lower urinary tract symptoms: Secondary | ICD-10-CM

## 2023-10-05 DIAGNOSIS — Z8551 Personal history of malignant neoplasm of bladder: Principal | ICD-10-CM

## 2023-10-05 DIAGNOSIS — R972 Elevated prostate specific antigen [PSA]: Secondary | ICD-10-CM

## 2023-10-05 DIAGNOSIS — Z8042 Family history of malignant neoplasm of prostate: Secondary | ICD-10-CM

## 2023-10-05 MED ORDER — LIDOCAINE 2 % MUCOSAL JELLY IN APPLICATOR
2 | Freq: Once | URETHRAL | Status: CP
Start: 2023-10-05 — End: ?
  Administered 2023-10-05: 10:00:00 2 mL via URETHRAL

## 2023-10-05 NOTE — Progress Notes
 Time Out DocumentationProcedure Name: cystoscopyProcedure Time: 9:33 AM Team Members Present: Consuelo JINNY George, RN,B. Price,ut Fairy JULIANNA Cove, MDTime Out initiated after patient positioned & prior to beginning of procedure: YesName of Patient  & MRUN # or DOB stated and matches ID band or previously confirmed med record number: YesProceduralist states or confirms procedure to be performed: YesProcedural consent is used to verify procedure performed  & matches pt identifiers:YesSite of procedure(s) (with laterality or level) is topically marked per policy & visible after draping:N/A Final check completed on all specimens: not applicableSerial number on device: [id number]

## 2023-10-05 NOTE — Procedures
 Office Cystoscopy Note Had a negative cystoscopy in April while in [place] .  His last BCG maintenance cycle was in January and he decided to forego any further treatments.Last PSA 3.96 August 2024Diagnosis:  Nonmuscle invasive bladder cancerAppropriate consent and time-out was performed prior to cystoscopy.Cystoscopy:  The patient was in the supine position. Genitalia were prepped with Betadine and draped.  A flexible cystoscope was placed into the urethra.  Urethra was normal throughout.  Prostatic enlargement noted.  Systematic inspection of the bladder was carried out.  No diverticula.  No stones.  No  tumors or erythema.  Mild trabeculation. Right and left ureteral orifices were orthotopic along the trigone.  Retroflexion was carried out and the bladder neck appeared normal.Plan:  Negative cystoscopic surveillance.  Discussed enlarged prostate but he has had no urinary complaints on tamsulosin daily.  Continue timed and double voiding.  No intervention indicated at this time.  Urine sent for cytology.  We will obtain his PSA from [place] .

## 2023-10-05 NOTE — Telephone Encounter
 [email or web address], [place], [address], [place], [place] [phone]) [phone] urologist, called to get records faxed

## 2023-10-06 ENCOUNTER — Encounter: Admit: 2023-10-06 | Payer: PRIVATE HEALTH INSURANCE

## 2023-10-19 ENCOUNTER — Ambulatory Visit: Admit: 2023-10-19 | Payer: PRIVATE HEALTH INSURANCE | Attending: Urology

## 2024-10-03 ENCOUNTER — Ambulatory Visit: Admit: 2024-10-03 | Payer: PRIVATE HEALTH INSURANCE | Attending: Urology
